# Patient Record
Sex: Female | Born: 1950 | Race: Black or African American | Hispanic: No | Smoking: Never smoker
Health system: Southern US, Community
[De-identification: ages and names within clinical notes are randomized; demographics above are authoritative.]

## PROBLEM LIST (undated history)

## (undated) DIAGNOSIS — M810 Age-related osteoporosis without current pathological fracture: Secondary | ICD-10-CM

## (undated) DIAGNOSIS — C50919 Malignant neoplasm of unspecified site of unspecified female breast: Secondary | ICD-10-CM

## (undated) DIAGNOSIS — K439 Ventral hernia without obstruction or gangrene: Secondary | ICD-10-CM

## (undated) DIAGNOSIS — D649 Anemia, unspecified: Secondary | ICD-10-CM

## (undated) DIAGNOSIS — Z9221 Personal history of antineoplastic chemotherapy: Secondary | ICD-10-CM

## (undated) DIAGNOSIS — Z923 Personal history of irradiation: Secondary | ICD-10-CM

## (undated) HISTORY — PX: LAPAROSCOPY: SHX197

## (undated) HISTORY — DX: Anemia, unspecified: D64.9

## (undated) HISTORY — PX: ABDOMINAL HYSTERECTOMY: SHX81

## (undated) HISTORY — DX: Malignant neoplasm of unspecified site of unspecified female breast: C50.919

## (undated) HISTORY — DX: Ventral hernia without obstruction or gangrene: K43.9

## (undated) HISTORY — DX: Age-related osteoporosis without current pathological fracture: M81.0

---

## 2013-10-07 DIAGNOSIS — Z9221 Personal history of antineoplastic chemotherapy: Secondary | ICD-10-CM

## 2013-10-07 DIAGNOSIS — C50919 Malignant neoplasm of unspecified site of unspecified female breast: Secondary | ICD-10-CM

## 2013-10-07 DIAGNOSIS — Z923 Personal history of irradiation: Secondary | ICD-10-CM

## 2013-10-07 HISTORY — DX: Malignant neoplasm of unspecified site of unspecified female breast: C50.919

## 2013-10-07 HISTORY — PX: BREAST LUMPECTOMY: SHX2

## 2013-10-07 HISTORY — DX: Personal history of irradiation: Z92.3

## 2013-10-07 HISTORY — DX: Personal history of antineoplastic chemotherapy: Z92.21

## 2013-11-07 HISTORY — PX: BREAST BIOPSY: SHX20

## 2018-01-13 ENCOUNTER — Other Ambulatory Visit: Payer: Self-pay | Admitting: *Deleted

## 2018-01-13 ENCOUNTER — Inpatient Hospital Stay
Admission: RE | Admit: 2018-01-13 | Discharge: 2018-01-13 | Disposition: A | Discharge status: Home / Self Care | Payer: Self-pay | Source: Ambulatory Visit | Attending: *Deleted | Admitting: *Deleted

## 2018-01-13 DIAGNOSIS — Z9289 Personal history of other medical treatment: Secondary | ICD-10-CM

## 2018-01-19 ENCOUNTER — Other Ambulatory Visit: Payer: Self-pay | Admitting: Internal Medicine

## 2018-01-19 DIAGNOSIS — C50911 Malignant neoplasm of unspecified site of right female breast: Secondary | ICD-10-CM

## 2018-01-19 DIAGNOSIS — Z170 Estrogen receptor positive status [ER+]: Principal | ICD-10-CM

## 2018-01-19 DIAGNOSIS — Z90710 Acquired absence of both cervix and uterus: Secondary | ICD-10-CM | POA: Insufficient documentation

## 2018-01-19 DIAGNOSIS — M810 Age-related osteoporosis without current pathological fracture: Secondary | ICD-10-CM | POA: Insufficient documentation

## 2018-01-23 ENCOUNTER — Other Ambulatory Visit: Payer: Self-pay | Admitting: *Deleted

## 2018-01-23 ENCOUNTER — Encounter: Payer: Self-pay | Admitting: Oncology

## 2018-01-23 ENCOUNTER — Inpatient Hospital Stay: Payer: Medicare HMO | Attending: Oncology | Admitting: Oncology

## 2018-01-23 VITALS — BP 168/81 | HR 77 | Temp 98.7°F | Resp 18 | Wt 117.7 lb

## 2018-01-23 DIAGNOSIS — Z79811 Long term (current) use of aromatase inhibitors: Secondary | ICD-10-CM | POA: Diagnosis not present

## 2018-01-23 DIAGNOSIS — N951 Menopausal and female climacteric states: Secondary | ICD-10-CM | POA: Diagnosis not present

## 2018-01-23 DIAGNOSIS — M810 Age-related osteoporosis without current pathological fracture: Secondary | ICD-10-CM | POA: Diagnosis not present

## 2018-01-23 DIAGNOSIS — Z5181 Encounter for therapeutic drug level monitoring: Secondary | ICD-10-CM

## 2018-01-23 DIAGNOSIS — M2550 Pain in unspecified joint: Secondary | ICD-10-CM | POA: Diagnosis not present

## 2018-01-23 DIAGNOSIS — Z170 Estrogen receptor positive status [ER+]: Secondary | ICD-10-CM | POA: Diagnosis not present

## 2018-01-23 DIAGNOSIS — C50911 Malignant neoplasm of unspecified site of right female breast: Secondary | ICD-10-CM | POA: Diagnosis present

## 2018-01-23 MED ORDER — ANASTROZOLE 1 MG PO TABS: 1.0000 mg | ORAL_TABLET | Freq: Every day | ORAL | 6 refills | Status: DC

## 2018-01-26 ENCOUNTER — Encounter: Payer: Self-pay | Admitting: Oncology

## 2018-01-26 DIAGNOSIS — C50919 Malignant neoplasm of unspecified site of unspecified female breast: Secondary | ICD-10-CM | POA: Insufficient documentation

## 2018-01-26 NOTE — Progress Notes (Signed)
Hematology/Oncology Consult note [facility] [phone] [phone]  Patient Care Team: Baxter Hire, MD as PCP - General (Internal Medicine)   Name of the patient: Ashley Ewing  [id number]  10/08/1950    Reason for referral- h/o breast cancer   Referring physician- Dr. 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  She was treated by Dr. Tyrone Nine at [place].  He received 12 cycles of Taxol and Herceptin following lumpectomy.  She completed adjuvant radiation in August 2015.  She has been on anastrozole since then.  She does have a history of osteoporosis and was previously on bisphosphonate which she stopped in 2017 due to cost issues.  She has now moved from [place] to [place] to live close to her cousin and plans to be here.  She is currently tolerating her anastrozole well and no significant side effects.  She does have occasional joint pain and hot flashes which are self-limited. Last mammogram was in 2017.   ECOG PS- 1  Pain scale- 0   Review of systems- Review of Systems  Constitutional: Negative for chills, fever, malaise/fatigue and weight loss.  HENT: Negative for congestion, ear discharge and nosebleeds.   Eyes: Negative for blurred vision.  Respiratory: Negative for cough, hemoptysis, sputum production, shortness of breath and wheezing.   Cardiovascular: Negative for chest pain, palpitations, orthopnea and claudication.  Gastrointestinal: Negative for abdominal pain, blood in stool, constipation, diarrhea, heartburn, melena, nausea and vomiting.  Genitourinary: Negative for dysuria, flank pain, frequency, hematuria and urgency.  Musculoskeletal: Positive for joint pain. Negative for back pain and myalgias.  Skin: Negative for  rash.  Neurological: Negative for dizziness, tingling, focal weakness, seizures, weakness and headaches.  Endo/Heme/Allergies: Does not bruise/bleed easily.  Psychiatric/Behavioral: Negative for depression and suicidal ideas. The patient does not have insomnia.     Allergies  Allergen Reactions  . Penicillins Hives    There are no active problems to display for this patient.    Past Medical History:  Diagnosis Date  . Anemia   . Breast cancer ([place])   . Hernia of abdominal wall   . Osteoporosis      Past Surgical History:  Procedure Laterality Date  . ABDOMINAL HYSTERECTOMY    . BREAST LUMPECTOMY Right   . LAPAROSCOPY      Social History   Socioeconomic History  . Marital status: Divorced    Spouse name: Not on file  . Number of children: Not on file  . Years of education: Not on file  . Highest education level: Not on file  Occupational History  . Not on file  Social Needs  . Financial resource strain: Not on file  . Food insecurity:    Worry: Not on file    Inability: Not on file  . Transportation needs:    Medical: Not on file    Non-medical: Not on file  Tobacco Use  . Smoking status: Not on file  Substance and Sexual Activity  . Alcohol use: Not on file  . Drug use: Not on file  . Sexual activity: Not on file  Lifestyle  . Physical activity:    Days per week: Not on file    Minutes per session: Not on file  . Stress: Not on file  Relationships  .  Social connections:    Talks on phone: Not on file    Gets together: Not on file    Attends religious service: Not on file    Active member of club or organization: Not on file    Attends meetings of clubs or organizations: Not on file    Relationship status: Not on file  . Intimate partner violence:    Fear of current or ex partner: Not on file    Emotionally abused: Not on file    Physically abused: Not on file    Forced sexual activity: Not on file  Other Topics Concern  . Not on file  Social  History Narrative  . Not on file     History reviewed. No pertinent family history.   Current Outpatient Medications:  .  anastrozole (ARIMIDEX) 1 MG tablet, Take 1 mg by mouth daily., Disp: , Rfl:  .  Calcium Carbonate-Vitamin D (CALCIUM 600+D PO), Take 600 mg by mouth daily., Disp: , Rfl:  .  MULTIPLE VITAMINS PO, Take 1 tablet by mouth daily., Disp: , Rfl:  .  anastrozole (ARIMIDEX) 1 MG tablet, Take 1 tablet (1 mg total) by mouth daily., Disp: 30 tablet, Rfl: 6   Physical exam:  Vitals:   01/23/18 1135  BP: (!) 168/81  Pulse: 77  Resp: 18  Temp: 98.7 F (37.1 C)  TempSrc: Tympanic  SpO2: 99%  Weight: 117 lb 11.2 oz (53.4 kg)   Physical Exam  Constitutional: She is oriented to person, place, and time.  HENT:  Head: Normocephalic and atraumatic.  Eyes: Pupils are equal, round, and reactive to light. EOM are normal.  Neck: Normal range of motion.  Cardiovascular: Normal rate, regular rhythm and normal heart sounds.  Pulmonary/Chest: Effort normal and breath sounds normal.  Abdominal: Soft. Bowel sounds are normal.  Neurological: She is alert and oriented to person, place, and time.  Skin: Skin is warm and dry.   Breast exam was performed in seated and lying down position. Patient is status post right lumpectomy with a well-healed surgical scar. No evidence of any palpable masses. No evidence of axillary adenopathy. No evidence of any palpable masses or lumps in the left breast. No evidence of leftt axillary adenopathy   Mm Outside Films Mammo  Result Date: 01/13/2018 This examination belongs to an outside facility and is stored here for comparison purposes only.  Contact the originating outside institution for any associated report or interpretation.   Assessment and plan- Patient is a 67 y.o. 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  She also completed  adjuvant radiation and is currently on arimidex    Patient will need to stay on Arimidex at least for 5 years if not for 10 years.  She would like to stop it next year when she completes 5 years.  I will refill her Arimidex prescription today.  She will also continue to take calcium 1200 mg along with vitamin D 800 IU. Her osteoporosis could be potentially worse due to this and requires monitoring  Patient does have a prior history of osteoporosis and we will obtain outside records including oncology notes pathology report and the last bone density scan.  We will schedule another bone density scan after that and discuss management of her osteoporosis following that  I will see her back in 6 months with CBC and CMP but potentially sooner if her bone  density scan is done  Dr. Edwina Barth her PCP will be coordinating her mammograms   Thank you for this kind referral and the opportunity to participate in the care of this patient   Visit Diagnosis 1. Visit for monitoring Arimidex therapy   2. Malignant neoplasm of right breast in female, estrogen receptor positive, unspecified site of breast ([place])   3. Osteoporosis, unspecified osteoporosis type, unspecified pathological fracture presence     Dr. Randa Evens, MD, MPH [facility] [phone] 01/26/2018 3:25 PM

## 2018-01-28 ENCOUNTER — Ambulatory Visit
Admission: RE | Admit: 2018-01-28 | Discharge: 2018-01-28 | Disposition: A | Discharge status: Home / Self Care | Payer: Medicare HMO | Source: Ambulatory Visit | Attending: Internal Medicine | Admitting: Internal Medicine

## 2018-01-28 DIAGNOSIS — Z9889 Other specified postprocedural states: Secondary | ICD-10-CM | POA: Diagnosis not present

## 2018-01-28 DIAGNOSIS — Z853 Personal history of malignant neoplasm of breast: Secondary | ICD-10-CM | POA: Insufficient documentation

## 2018-01-28 DIAGNOSIS — Z170 Estrogen receptor positive status [ER+]: Principal | ICD-10-CM

## 2018-01-28 DIAGNOSIS — C50911 Malignant neoplasm of unspecified site of right female breast: Secondary | ICD-10-CM

## 2018-01-28 HISTORY — DX: Personal history of irradiation: Z92.3

## 2018-01-28 HISTORY — DX: Personal history of antineoplastic chemotherapy: Z92.21

## 2018-07-20 ENCOUNTER — Other Ambulatory Visit: Payer: Self-pay

## 2018-07-20 DIAGNOSIS — E785 Hyperlipidemia, unspecified: Secondary | ICD-10-CM | POA: Insufficient documentation

## 2018-07-20 DIAGNOSIS — Z170 Estrogen receptor positive status [ER+]: Principal | ICD-10-CM

## 2018-07-20 DIAGNOSIS — C50911 Malignant neoplasm of unspecified site of right female breast: Secondary | ICD-10-CM

## 2018-07-21 ENCOUNTER — Other Ambulatory Visit: Payer: Self-pay | Admitting: Oncology

## 2018-07-24 ENCOUNTER — Encounter: Payer: Self-pay | Admitting: Oncology

## 2018-07-24 ENCOUNTER — Inpatient Hospital Stay: Payer: Medicare HMO

## 2018-07-24 ENCOUNTER — Inpatient Hospital Stay: Payer: Medicare HMO | Attending: Oncology | Admitting: Oncology

## 2018-07-24 VITALS — BP 152/81 | HR 65 | Temp 98.4°F | Resp 18 | Ht 64.5 in | Wt 129.7 lb

## 2018-07-24 DIAGNOSIS — Z08 Encounter for follow-up examination after completed treatment for malignant neoplasm: Secondary | ICD-10-CM

## 2018-07-24 DIAGNOSIS — Z79811 Long term (current) use of aromatase inhibitors: Secondary | ICD-10-CM

## 2018-07-24 DIAGNOSIS — Z853 Personal history of malignant neoplasm of breast: Secondary | ICD-10-CM

## 2018-07-24 DIAGNOSIS — C50911 Malignant neoplasm of unspecified site of right female breast: Secondary | ICD-10-CM | POA: Diagnosis present

## 2018-07-24 DIAGNOSIS — Z5181 Encounter for therapeutic drug level monitoring: Secondary | ICD-10-CM

## 2018-07-24 DIAGNOSIS — Z170 Estrogen receptor positive status [ER+]: Principal | ICD-10-CM

## 2018-07-24 DIAGNOSIS — M8580 Other specified disorders of bone density and structure, unspecified site: Secondary | ICD-10-CM | POA: Insufficient documentation

## 2018-07-24 LAB — COMPREHENSIVE METABOLIC PANEL
ALK PHOS: 127 U/L — AB (ref 38–126)
ALT: 14 U/L (ref 0–44)
AST: 26 U/L (ref 15–41)
Albumin: 4.1 g/dL (ref 3.5–5.0)
Anion gap: 9 (ref 5–15)
BILIRUBIN TOTAL: 0.6 mg/dL (ref 0.3–1.2)
BUN: 10 mg/dL (ref 8–23)
CALCIUM: 9.8 mg/dL (ref 8.9–10.3)
CO2: 28 mmol/L (ref 22–32)
CREATININE: 0.82 mg/dL (ref 0.44–1.00)
Chloride: 105 mmol/L (ref 98–111)
GFR calc Af Amer: >60 mL/min (ref >60)
Glucose, Bld: 101 mg/dL — ABNORMAL HIGH (ref 70–99)
Potassium: 4.2 mmol/L (ref 3.5–5.1)
Sodium: 142 mmol/L (ref 135–145)
TOTAL PROTEIN: 6.9 g/dL (ref 6.5–8.1)

## 2018-07-24 LAB — CBC WITH DIFFERENTIAL/PLATELET
Abs Immature Granulocytes: 0.01 10*3/uL (ref 0.00–0.07)
BASOS PCT: 0 %
Basophils Absolute: 0 10*3/uL (ref 0.0–0.1)
EOS PCT: 2 %
Eosinophils Absolute: 0.1 10*3/uL (ref 0.0–0.5)
HCT: 36.9 % (ref 36.0–46.0)
Hemoglobin: 12.1 g/dL (ref 12.0–15.0)
Immature Granulocytes: 0 %
Lymphocytes Relative: 40 %
Lymphs Abs: 1.9 10*3/uL (ref 0.7–4.0)
MCH: 29.4 pg (ref 26.0–34.0)
MCHC: 32.8 g/dL (ref 30.0–36.0)
MCV: 89.6 fL (ref 80.0–100.0)
MONO ABS: 0.4 10*3/uL (ref 0.1–1.0)
MONOS PCT: 8 %
Neutro Abs: 2.3 10*3/uL (ref 1.7–7.7)
Neutrophils Relative %: 50 %
PLATELETS: 222 10*3/uL (ref 150–400)
RBC: 4.12 MIL/uL (ref 3.87–5.11)
RDW: 12.5 % (ref 11.5–15.5)
WBC: 4.7 10*3/uL (ref 4.0–10.5)
nRBC: 0 % (ref 0.0–0.2)

## 2018-07-24 NOTE — Progress Notes (Signed)
No new changes noted today 

## 2018-07-27 NOTE — Progress Notes (Signed)
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  She was treated by Dr. Tyrone Nine at [place].  He received 12 cycles of Taxol and Herceptin following lumpectomy.  She completed adjuvant radiation in August 2015.  She has been on anastrozole since then.  She does have a history of osteoporosis and was previously on bisphosphonate which she stopped in 2017 due to cost issues.  She has now moved from [place] to [place] to live close to her cousin and plans to be here.  She is currently tolerating her anastrozole well and no significant side effects.  She does have occasional joint pain and hot flashes which are self-limited.   Interval history-she is tolerating her Arimidex well without any significant side effects.  However she wants to stop hormone therapy at 5 years which would be next year.  She denies any fatigue joint pains or hot flashes  ECOG PS- 1 Pain scale- 0 Opioid associated constipation- no  Review of systems- Review of Systems  Constitutional: Negative for chills, fever, malaise/fatigue and weight loss.  HENT: Negative for congestion, ear discharge and nosebleeds.   Eyes: Negative for blurred vision.  Respiratory: Negative for cough, hemoptysis, sputum production, shortness of breath and wheezing.   Cardiovascular: Negative for chest pain, palpitations, orthopnea and claudication.  Gastrointestinal: Negative for abdominal pain, blood in stool, constipation,  diarrhea, heartburn, melena, nausea and vomiting.  Genitourinary: Negative for dysuria, flank pain, frequency, hematuria and urgency.  Musculoskeletal: Negative for back pain, joint pain and myalgias.  Skin: Negative for rash.  Neurological: Negative for dizziness, tingling, focal weakness, seizures, weakness and headaches.  Endo/Heme/Allergies: Does not bruise/bleed easily.  Psychiatric/Behavioral: Negative for depression and suicidal ideas. The patient does not have insomnia.       Allergies  Allergen Reactions  . Penicillins Hives     Past Medical History:  Diagnosis Date  . Anemia   . Breast cancer ([place]) 2015   right breast ca invasive ductal carcinoma  . Hernia of abdominal wall   . Osteoporosis   . Personal history of chemotherapy 2015   right breast ca  . Personal history of radiation therapy 2015   right breast     Past Surgical History:  Procedure Laterality Date  . ABDOMINAL HYSTERECTOMY    . BREAST BIOPSY Right 11/2013   invasive ductal carcinoma  . 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 LAPAROSCOPY      Social History   Socioeconomic History  . Marital status: Divorced    Spouse name: Not on file  . Number of children: Not on file  . Years of education: Not on file  . Highest education level: Not on file  Occupational History  . Not on file  Social Needs  . Financial resource strain: Not on file  . Food insecurity:    Worry: Not on file    Inability: Not on file  . Transportation  needs:    Medical: Not on file    Non-medical: Not on file  Tobacco Use  . Smoking status: Never Smoker  . Smokeless tobacco: Never Used  Substance and Sexual Activity  . Alcohol use: Not on file  . Drug use: Not on file  . Sexual activity: Not on file  Lifestyle  . Physical activity:    Days per week: Not on file    Minutes per session: Not on file  . Stress: Not on file  Relationships  .  Social connections:    Talks on phone: Not on file    Gets together: Not on file    Attends religious service: Not on file    Active member of club or organization: Not on file    Attends meetings of clubs or organizations: Not on file    Relationship status: Not on file  . Intimate partner violence:    Fear of current or ex partner: Not on file    Emotionally abused: Not on file    Physically abused: Not on file    Forced sexual activity: Not on file  Other Topics Concern  . Not on file  Social History Narrative  . Not on file    History reviewed. No pertinent family history.   Current Outpatient Medications:  .  anastrozole (ARIMIDEX) 1 MG tablet, Take 1 mg by mouth daily., Disp: , Rfl:  .  Calcium Carbonate-Vitamin D (CALCIUM 600+D PO), Take 600 mg by mouth daily., Disp: , Rfl:  .  MULTIPLE VITAMINS PO, Take 1 tablet by mouth daily., Disp: , Rfl:  .  anastrozole (ARIMIDEX) 1 MG tablet, TAKE 1 TABLET BY MOUTH EVERY DAY, Disp: 90 tablet, Rfl: 0  Physical exam:  Vitals:   07/24/18 1419  BP: (!) 152/81  Pulse: 65  Resp: 18  Temp: 98.4 F (36.9 C)  TempSrc: Tympanic  SpO2: 97%  Weight: 129 lb 11.2 oz (58.8 kg)  Height: 5' 4.5" (1.638 m)   Physical Exam  Constitutional: She is oriented to person, place, and time.  Patient is thin.  Does not appear to be in any acute distress  HENT:  Head: Normocephalic and atraumatic.  Eyes: Pupils are equal, round, and reactive to light. EOM are normal.  Neck: Normal range of motion.  Cardiovascular: Normal rate, regular rhythm and normal heart sounds.  Pulmonary/Chest: Effort normal and breath sounds normal.  Abdominal: Soft. Bowel sounds are normal.  Neurological: She is alert and oriented to person, place, and time.  Skin: Skin is warm and dry.   Breast exam was performed in seated and lying down position. Patient is status post right lumpectomy with a well-healed surgical scar. No evidence of any palpable masses. No evidence of  axillary adenopathy. No evidence of any palpable masses or lumps in the left breast. No evidence of leftt axillary adenopathy   CMP Latest Ref Rng & Units 07/24/2018  Glucose 70 - 99 mg/dL 101(H)  BUN 8 - 23 mg/dL 10  Creatinine 0.44 - 1.00 mg/dL 0.82  Sodium 135 - 145 mmol/L 142  Potassium 3.5 - 5.1 mmol/L 4.2  Chloride 98 - 111 mmol/L 105  CO2 22 - 32 mmol/L 28  Calcium 8.9 - 10.3 mg/dL 9.8  Total Protein 6.5 - 8.1 g/dL 6.9  Total Bilirubin 0.3 - 1.2 mg/dL 0.6  Alkaline Phos 38 - 126 U/L 127(H)  AST 15 - 41 U/L 26  ALT 0 - 44 U/L 14   CBC Latest Ref Rng &  Units 07/24/2018  WBC 4.0 - 10.5 K/uL 4.7  Hemoglobin 12.0 - 15.0 g/dL 12.1  Hematocrit 36.0 - 46.0 % 36.9  Platelets 150 - 400 K/uL 222     Assessment and plan- Patient is a 67 y.o. 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  She also completed adjuvant radiation and is currently on arimidex.  She is here for routine surveillance of her breast cancer  Prior bone density scan done at [facility] in 2016 did show osteopenia.  We will repeat the bone density scan at this time.  Patient will continue with Arimidex along with calcium and vitamin D at this time and I will see her back in 6 months for routine follow-up but potentially sooner if there are any concerning findings on her repeat bone density scan.  Likely she is doing well and there is no evidence of recurrence on today's exam.  She did have a mammogram in April 2019 which did not reveal any evidence of malignancy   Visit Diagnosis 1. Encounter for follow-up surveillance of breast cancer   2. Visit for monitoring Arimidex therapy      Dr. Randa Evens, MD, MPH [facility] at [facility] [phone] 07/27/2018 1:43 PM

## 2018-08-10 ENCOUNTER — Other Ambulatory Visit: Payer: Self-pay | Admitting: *Deleted

## 2018-08-10 ENCOUNTER — Telehealth: Payer: Self-pay | Admitting: *Deleted

## 2018-08-10 DIAGNOSIS — Z170 Estrogen receptor positive status [ER+]: Principal | ICD-10-CM

## 2018-08-10 DIAGNOSIS — Z79811 Long term (current) use of aromatase inhibitors: Secondary | ICD-10-CM

## 2018-08-10 DIAGNOSIS — C50911 Malignant neoplasm of unspecified site of right female breast: Secondary | ICD-10-CM

## 2018-08-10 DIAGNOSIS — Z5181 Encounter for therapeutic drug level monitoring: Secondary | ICD-10-CM

## 2018-08-10 NOTE — Telephone Encounter (Signed)
I called patient back and left her a message that I did receive her message [facility] I have [facility] bone density and the date is 08/20/18 at 1:40 and arrive 15 min early at [facility] breast . Pt agreeable to the plan

## 2018-08-10 NOTE — Telephone Encounter (Signed)
Pt called to see if we got her last bone density from [facility]  WE did and the bone density was performed 05/24/2017. I have entered a bone density test. I have called mammography several times but the voicemail keeps coming on. I will keep trying and then call pt with  The date and time.  The patient did not answer when I called her so I left a detailed message with above on the message.

## 2018-08-10 NOTE — Telephone Encounter (Signed)
-----   Message from Secundino Ginger sent at 08/10/2018 11:43 AM EST ----- Regarding: test results Contact: [phone] Viann called and wanted to know if you received the Bone density results from [place] from her doctor there.

## 2018-08-20 ENCOUNTER — Ambulatory Visit
Admission: RE | Admit: 2018-08-20 | Discharge: 2018-08-20 | Disposition: A | Discharge status: Home / Self Care | Payer: Medicare HMO | Source: Ambulatory Visit | Attending: Oncology | Admitting: Oncology

## 2018-08-20 ENCOUNTER — Telehealth: Payer: Self-pay

## 2018-08-20 DIAGNOSIS — Z170 Estrogen receptor positive status [ER+]: Secondary | ICD-10-CM | POA: Diagnosis present

## 2018-08-20 DIAGNOSIS — Z5181 Encounter for therapeutic drug level monitoring: Secondary | ICD-10-CM | POA: Diagnosis present

## 2018-08-20 DIAGNOSIS — C50911 Malignant neoplasm of unspecified site of right female breast: Secondary | ICD-10-CM | POA: Diagnosis not present

## 2018-08-20 DIAGNOSIS — Z79811 Long term (current) use of aromatase inhibitors: Secondary | ICD-10-CM | POA: Diagnosis present

## 2018-08-20 NOTE — Telephone Encounter (Signed)
Spoke with the patient to infrom her that Dr Janese Banks would like to see her in 2-3 weeks for bone density results. The patient was agreeable and understanding. Patient has been schedule for 09/10/18 @ 9:45 AM.

## 2018-08-20 NOTE — Telephone Encounter (Signed)
-----   Message from Luella Cook, RN sent at 08/20/2018  4:22 PM EST -----   ----- Message ----- From: Sindy Guadeloupe, MD Sent: 08/20/2018   2:18 PM EST To: Luella Cook, RN  Bone density shows osteoporosis. Prior one showed osteopenia. I need to see her in 2-3 weeks to discuss this and see if she would be willing to take bisphosphonates. Thanks, Astrid Divine

## 2018-09-10 ENCOUNTER — Inpatient Hospital Stay: Payer: Medicare HMO | Admitting: Oncology

## 2018-10-18 ENCOUNTER — Other Ambulatory Visit: Payer: Self-pay | Admitting: Oncology

## 2019-01-26 ENCOUNTER — Inpatient Hospital Stay: Payer: Medicare HMO | Attending: Oncology | Admitting: Oncology

## 2019-01-27 NOTE — Telephone Encounter (Signed)
error 

## 2019-04-13 ENCOUNTER — Inpatient Hospital Stay: Payer: Medicare HMO | Attending: Oncology | Admitting: Oncology

## 2019-04-13 ENCOUNTER — Other Ambulatory Visit: Payer: Self-pay

## 2019-04-13 ENCOUNTER — Encounter: Payer: Self-pay | Admitting: Oncology

## 2019-04-13 VITALS — BP 163/81 | HR 65 | Temp 97.6°F | Wt 131.0 lb

## 2019-04-13 DIAGNOSIS — Z5181 Encounter for therapeutic drug level monitoring: Secondary | ICD-10-CM

## 2019-04-13 DIAGNOSIS — Z79811 Long term (current) use of aromatase inhibitors: Secondary | ICD-10-CM

## 2019-04-13 DIAGNOSIS — Z853 Personal history of malignant neoplasm of breast: Secondary | ICD-10-CM | POA: Diagnosis present

## 2019-04-13 DIAGNOSIS — M810 Age-related osteoporosis without current pathological fracture: Secondary | ICD-10-CM

## 2019-04-13 DIAGNOSIS — Z08 Encounter for follow-up examination after completed treatment for malignant neoplasm: Secondary | ICD-10-CM | POA: Diagnosis present

## 2019-04-13 NOTE — Progress Notes (Signed)
Pt in for follow up. Denies any concerns or difficulties.

## 2019-04-13 NOTE — Progress Notes (Signed)
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 She was treated by Dr. Tyrone Nine at [facility]. He received 12 cycles of Taxol and Herceptin following lumpectomy. She completed adjuvant radiation in August 2015.She has been on anastrozole since then. She does have a history of osteoporosis and was previously on bisphosphonate which she stopped in 2017 due to cost issues.  She has been on Arimidex since April 2015 and stopped taking it in April 2020.  Interval history-patient was last seen by me in October 2019 and did not follow-up since then.  She finished 5 years of Arimidex and stopped treatment in April 2020.  Currently she reports feeling well and denies any complaints at this time.  Her appetite and weight have remained stable.  Denies any new aches or pains anywhere  ECOG PS- 0 Pain scale- 0   Review of systems- Review of Systems  Constitutional: Negative for chills, fever, malaise/fatigue and weight loss.  HENT: Negative for congestion, ear discharge and nosebleeds.   Eyes: Negative for blurred vision.  Respiratory: Negative for cough, hemoptysis, sputum production, shortness of breath and wheezing.   Cardiovascular: Negative for chest pain, palpitations, orthopnea and claudication.  Gastrointestinal: Negative for abdominal pain, blood in stool, constipation, diarrhea, heartburn, melena, nausea and vomiting.  Genitourinary: Negative for dysuria, flank pain,  frequency, hematuria and urgency.  Musculoskeletal: Negative for back pain, joint pain and myalgias.  Skin: Negative for rash.  Neurological: Negative for dizziness, tingling, focal weakness, seizures, weakness and headaches.  Endo/Heme/Allergies: Does not bruise/bleed easily.  Psychiatric/Behavioral: Negative for depression and suicidal ideas. The patient does not have insomnia.      Allergies  Allergen Reactions  . Penicillins Hives     Past Medical History:  Diagnosis Date  . Anemia   . Breast cancer ([place]) 2015   right breast ca invasive ductal carcinoma  . Hernia of abdominal wall   . Osteoporosis   . Personal history of chemotherapy 2015   right breast ca  . Personal history of radiation therapy 2015   right breast     Past Surgical History:  Procedure Laterality Date  . ABDOMINAL HYSTERECTOMY    . BREAST BIOPSY Right 11/2013   invasive ductal carcinoma  . 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 LAPAROSCOPY      Social History   Socioeconomic History  . Marital status: Divorced    Spouse name: Not on file  . Number of children: Not on file  . Years of education: Not on file  . Highest education level: Not on file  Occupational History  . Not on file  Social Needs  . Financial resource strain: Not on file  . Food insecurity    Worry: Not on file    Inability: Not on file  . Transportation needs    Medical: Not on file    Non-medical: Not on file  Tobacco Use  . Smoking status: Never Smoker  . Smokeless  tobacco: Never Used  Substance and Sexual Activity  . Alcohol use: Not on file  . Drug use: Not on file  . Sexual activity: Not on file  Lifestyle  . Physical activity    Days per week: Not on file    Minutes per session: Not on file  . Stress: Not on file  Relationships  . Social Herbalist on phone: Not on file    Gets together: Not on file    Attends religious  service: Not on file    Active member of club or organization: Not on file    Attends meetings of clubs or organizations: Not on file    Relationship status: Not on file  . Intimate partner violence    Fear of current or ex partner: Not on file    Emotionally abused: Not on file    Physically abused: Not on file    Forced sexual activity: Not on file  Other Topics Concern  . Not on file  Social History Narrative  . Not on file    No family history on file.   Current Outpatient Medications:  .  anastrozole (ARIMIDEX) 1 MG tablet, Take 1 mg by mouth daily., Disp: , Rfl:  .  anastrozole (ARIMIDEX) 1 MG tablet, TAKE 1 TABLET BY MOUTH EVERY DAY, Disp: 90 tablet, Rfl: 0 .  Calcium Carbonate-Vitamin D (CALCIUM 600+D PO), Take 600 mg by mouth daily., Disp: , Rfl:  .  MULTIPLE VITAMINS PO, Take 1 tablet by mouth daily., Disp: , Rfl:   Physical exam:  Vitals:   04/13/19 0912  BP: (!) 163/81  Pulse: 65  Temp: 97.6 F (36.4 C)  TempSrc: Tympanic  Weight: 131 lb (59.4 kg)   Physical Exam HENT:     Head: Normocephalic and atraumatic.  Eyes:     Pupils: Pupils are equal, round, and reactive to light.  Neck:     Musculoskeletal: Normal range of motion.  Cardiovascular:     Rate and Rhythm: Normal rate and regular rhythm.     Heart sounds: Normal heart sounds.  Pulmonary:     Effort: Pulmonary effort is normal.     Breath sounds: Normal breath sounds.  Abdominal:     General: Bowel sounds are normal.     Palpations: Abdomen is soft.  Skin:    General: Skin is warm and dry.  Neurological:     Mental Status: She is alert and oriented to person, place, and time.      CMP Latest Ref Rng & Units 07/24/2018  Glucose 70 - 99 mg/dL 101(H)  BUN 8 - 23 mg/dL 10  Creatinine 0.44 - 1.00 mg/dL 0.82  Sodium 135 - 145 mmol/L 142  Potassium 3.5 - 5.1 mmol/L 4.2  Chloride 98 - 111 mmol/L 105  CO2 22 - 32 mmol/L 28  Calcium 8.9 - 10.3 mg/dL 9.8  Total Protein 6.5 - 8.1 g/dL 6.9  Total  Bilirubin 0.3 - 1.2 mg/dL 0.6  Alkaline Phos 38 - 126 U/L 127(H)  AST 15 - 41 U/L 26  ALT 0 - 44 U/L 14   CBC Latest Ref Rng & Units 07/24/2018  WBC 4.0 - 10.5 K/uL 4.7  Hemoglobin 12.0 - 15.0 g/dL 12.1  Hematocrit 36.0 - 46.0 % 36.9  Platelets 150 - 400 K/uL 222     Assessment and plan- Patient is a 68 y.o. female  with a history of stage I right breast invasive ductal carcinoma ER positive, PR  positive HER-2 positive status post with Herceptin following lumpectomy in 2015. She also completed adjuvant radiation and is currently on arimidex.  This is a routine follow-up visit for breast cancer  From breast cancer standpoint patient has completed 5 years of adjuvant Arimidex.  Given her ongoing osteoporosis it may be okay for her to stop it at this time which she did in April 2020.  Patient can continue to follow-up with Dr. Edwina Barth and get yearly mammograms and breast exams through him.  She can be referred to [place] in the future if questions or concerns arise.  No role for routine tumor markers or scans in the absence of concerning signs and symptoms.  Bone density scan in November 2019 showed osteoporosis with a T score of -3.3 at the AP spine.  Patient was previously getting parenteral bisphosphonates which she stopped because of cost issues before moving to [place].  I have encouraged her to discuss options for her osteoporosis with Dr. Edwina Barth including weekly Fosamax and monitoring bone density scan every other year.  Patient verbalized understanding   Visit Diagnosis 1. Encounter for follow-up surveillance of breast cancer   2. Visit for monitoring Arimidex therapy      Dr. Randa Evens, MD, MPH [facility] [phone] 04/13/2019 9:06 AM

## 2019-04-16 ENCOUNTER — Other Ambulatory Visit: Payer: Self-pay | Admitting: Internal Medicine

## 2019-04-16 DIAGNOSIS — C50911 Malignant neoplasm of unspecified site of right female breast: Secondary | ICD-10-CM

## 2019-04-16 DIAGNOSIS — Z170 Estrogen receptor positive status [ER+]: Secondary | ICD-10-CM

## 2019-04-29 ENCOUNTER — Ambulatory Visit
Admission: RE | Admit: 2019-04-29 | Discharge: 2019-04-29 | Disposition: A | Discharge status: Home / Self Care | Payer: Medicare HMO | Source: Ambulatory Visit | Attending: Internal Medicine | Admitting: Internal Medicine

## 2019-04-29 DIAGNOSIS — Z170 Estrogen receptor positive status [ER+]: Secondary | ICD-10-CM | POA: Diagnosis present

## 2019-04-29 DIAGNOSIS — R921 Mammographic calcification found on diagnostic imaging of breast: Secondary | ICD-10-CM | POA: Diagnosis not present

## 2019-04-29 DIAGNOSIS — C50911 Malignant neoplasm of unspecified site of right female breast: Secondary | ICD-10-CM | POA: Diagnosis present

## 2019-05-03 ENCOUNTER — Other Ambulatory Visit: Payer: Self-pay | Admitting: Internal Medicine

## 2019-05-03 DIAGNOSIS — R921 Mammographic calcification found on diagnostic imaging of breast: Secondary | ICD-10-CM

## 2019-11-03 ENCOUNTER — Ambulatory Visit
Admission: RE | Admit: 2019-11-03 | Discharge: 2019-11-03 | Disposition: A | Discharge status: Home / Self Care | Payer: Medicare HMO | Source: Ambulatory Visit | Attending: Internal Medicine | Admitting: Internal Medicine

## 2019-11-03 DIAGNOSIS — R921 Mammographic calcification found on diagnostic imaging of breast: Secondary | ICD-10-CM | POA: Diagnosis present

## 2019-11-05 ENCOUNTER — Other Ambulatory Visit: Payer: Self-pay | Admitting: Internal Medicine

## 2019-11-05 DIAGNOSIS — Z1231 Encounter for screening mammogram for malignant neoplasm of breast: Secondary | ICD-10-CM

## 2019-11-05 DIAGNOSIS — R921 Mammographic calcification found on diagnostic imaging of breast: Secondary | ICD-10-CM

## 2019-11-05 DIAGNOSIS — R928 Other abnormal and inconclusive findings on diagnostic imaging of breast: Secondary | ICD-10-CM

## 2020-01-14 ENCOUNTER — Other Ambulatory Visit: Payer: Medicare HMO

## 2020-01-24 ENCOUNTER — Other Ambulatory Visit: Payer: Self-pay

## 2020-01-24 ENCOUNTER — Ambulatory Visit (LOCAL_COMMUNITY_HEALTH_CENTER): Payer: Self-pay

## 2020-01-24 DIAGNOSIS — Z111 Encounter for screening for respiratory tuberculosis: Secondary | ICD-10-CM

## 2020-01-27 ENCOUNTER — Ambulatory Visit (LOCAL_COMMUNITY_HEALTH_CENTER): Payer: Medicare HMO

## 2020-01-27 ENCOUNTER — Other Ambulatory Visit: Payer: Self-pay

## 2020-01-27 DIAGNOSIS — Z111 Encounter for screening for respiratory tuberculosis: Secondary | ICD-10-CM

## 2020-01-27 LAB — TB SKIN TEST
Induration: 0 mm
TB Skin Test: NEGATIVE

## 2020-05-01 ENCOUNTER — Ambulatory Visit
Admission: RE | Admit: 2020-05-01 | Discharge: 2020-05-01 | Disposition: A | Discharge status: Home / Self Care | Payer: Medicare HMO | Source: Ambulatory Visit | Attending: Internal Medicine | Admitting: Internal Medicine

## 2020-05-01 DIAGNOSIS — R928 Other abnormal and inconclusive findings on diagnostic imaging of breast: Secondary | ICD-10-CM | POA: Insufficient documentation

## 2020-05-01 DIAGNOSIS — R921 Mammographic calcification found on diagnostic imaging of breast: Secondary | ICD-10-CM | POA: Insufficient documentation

## 2020-05-01 DIAGNOSIS — Z1231 Encounter for screening mammogram for malignant neoplasm of breast: Secondary | ICD-10-CM | POA: Diagnosis present

## 2020-07-07 ENCOUNTER — Other Ambulatory Visit: Payer: Self-pay | Admitting: Internal Medicine

## 2020-07-07 DIAGNOSIS — R10811 Right upper quadrant abdominal tenderness: Secondary | ICD-10-CM

## 2020-07-10 ENCOUNTER — Other Ambulatory Visit: Payer: Self-pay

## 2020-07-10 ENCOUNTER — Ambulatory Visit
Admission: RE | Admit: 2020-07-10 | Discharge: 2020-07-10 | Disposition: A | Discharge status: Home / Self Care | Payer: Medicare HMO | Source: Ambulatory Visit | Attending: Internal Medicine | Admitting: Internal Medicine

## 2020-07-10 DIAGNOSIS — R10811 Right upper quadrant abdominal tenderness: Secondary | ICD-10-CM | POA: Diagnosis present

## 2021-01-30 ENCOUNTER — Other Ambulatory Visit: Payer: Self-pay | Admitting: Internal Medicine

## 2021-01-30 DIAGNOSIS — C50911 Malignant neoplasm of unspecified site of right female breast: Secondary | ICD-10-CM

## 2021-05-02 ENCOUNTER — Inpatient Hospital Stay: Admission: RE | Admit: 2021-05-02 | Payer: Medicare Other | Source: Ambulatory Visit

## 2021-05-11 ENCOUNTER — Other Ambulatory Visit: Payer: Self-pay

## 2021-05-11 ENCOUNTER — Ambulatory Visit
Admission: RE | Admit: 2021-05-11 | Discharge: 2021-05-11 | Disposition: A | Discharge status: Home / Self Care | Payer: Medicare Other | Source: Ambulatory Visit | Attending: Internal Medicine | Admitting: Internal Medicine

## 2021-05-11 DIAGNOSIS — Z170 Estrogen receptor positive status [ER+]: Secondary | ICD-10-CM | POA: Insufficient documentation

## 2021-05-11 DIAGNOSIS — R921 Mammographic calcification found on diagnostic imaging of breast: Secondary | ICD-10-CM | POA: Diagnosis present

## 2021-05-11 DIAGNOSIS — C50911 Malignant neoplasm of unspecified site of right female breast: Secondary | ICD-10-CM

## 2021-05-11 DIAGNOSIS — Z853 Personal history of malignant neoplasm of breast: Secondary | ICD-10-CM | POA: Insufficient documentation

## 2022-03-28 ENCOUNTER — Other Ambulatory Visit: Payer: Self-pay | Admitting: Internal Medicine

## 2022-03-28 DIAGNOSIS — Z1231 Encounter for screening mammogram for malignant neoplasm of breast: Secondary | ICD-10-CM

## 2022-04-12 ENCOUNTER — Other Ambulatory Visit: Payer: Self-pay | Admitting: Internal Medicine

## 2022-04-12 DIAGNOSIS — Z1231 Encounter for screening mammogram for malignant neoplasm of breast: Secondary | ICD-10-CM

## 2022-05-14 ENCOUNTER — Ambulatory Visit
Admission: RE | Admit: 2022-05-14 | Discharge: 2022-05-14 | Disposition: A | Discharge status: Home / Self Care | Payer: Medicare Other | Source: Ambulatory Visit | Attending: Internal Medicine | Admitting: Internal Medicine

## 2022-05-14 DIAGNOSIS — Z1231 Encounter for screening mammogram for malignant neoplasm of breast: Secondary | ICD-10-CM | POA: Diagnosis present

## 2022-11-25 ENCOUNTER — Other Ambulatory Visit: Payer: Medicare Other

## 2022-11-25 ENCOUNTER — Ambulatory Visit (LOCAL_COMMUNITY_HEALTH_CENTER): Payer: Self-pay

## 2022-11-25 DIAGNOSIS — Z111 Encounter for screening for respiratory tuberculosis: Secondary | ICD-10-CM

## 2022-11-28 ENCOUNTER — Ambulatory Visit (LOCAL_COMMUNITY_HEALTH_CENTER): Payer: Medicare Other

## 2022-11-28 DIAGNOSIS — Z111 Encounter for screening for respiratory tuberculosis: Secondary | ICD-10-CM

## 2022-11-28 LAB — TB SKIN TEST
Induration: 0 mm
TB Skin Test: NEGATIVE

## 2023-01-28 IMAGING — MG DIGITAL DIAGNOSTIC BILAT W/ TOMO W/ CAD
9 of 13 series · 9 of 33 positions shown · non-contrast
Comparison: Previous exam(s).

CLINICAL DATA: Two year follow-up for probably benign
calcifications in the RIGHT lumpectomy site. History of RIGHT
lumpectomy with radiation treatment in 0198.

EXAM:
DIGITAL DIAGNOSTIC BILATERAL MAMMOGRAM WITH TOMOSYNTHESIS AND CAD
TECHNIQUE: Bilateral digital diagnostic mammography and breast tomosynthesis
was performed. The images were evaluated with computer-aided
detection.

[R MLO]
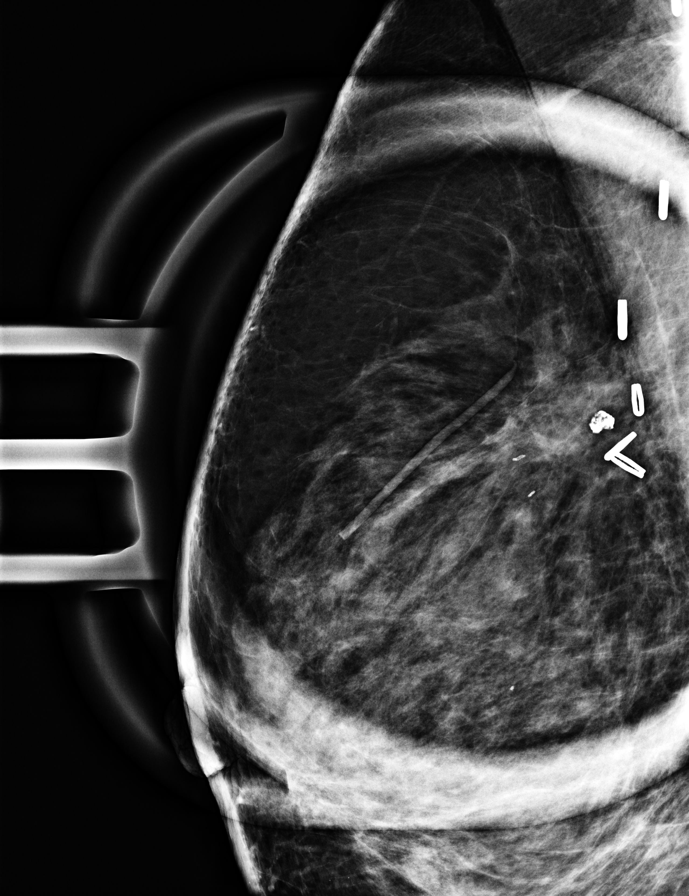

[R XCCL (1 of 2)]
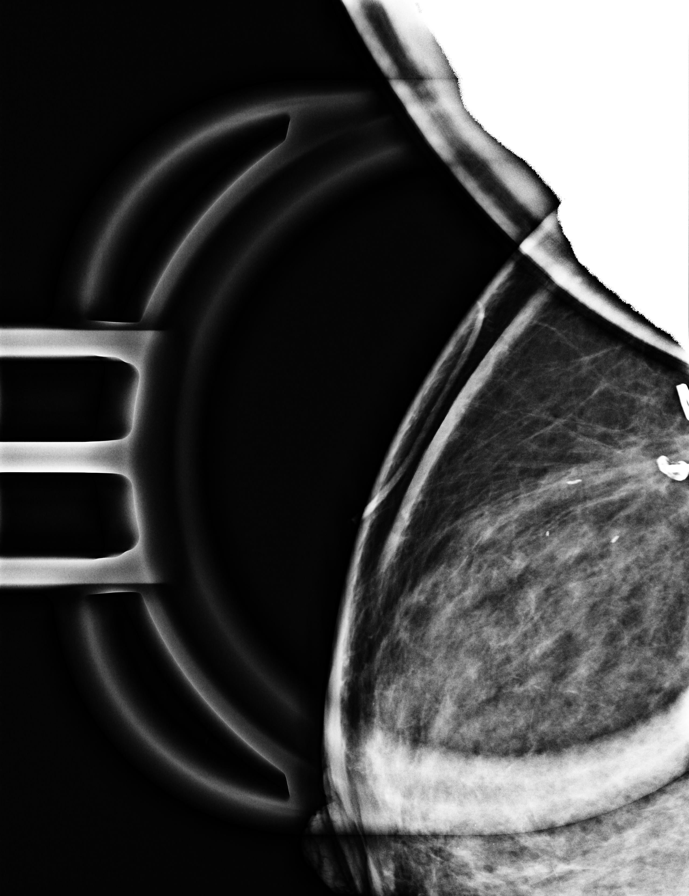

[R XCCL (2 of 2)]
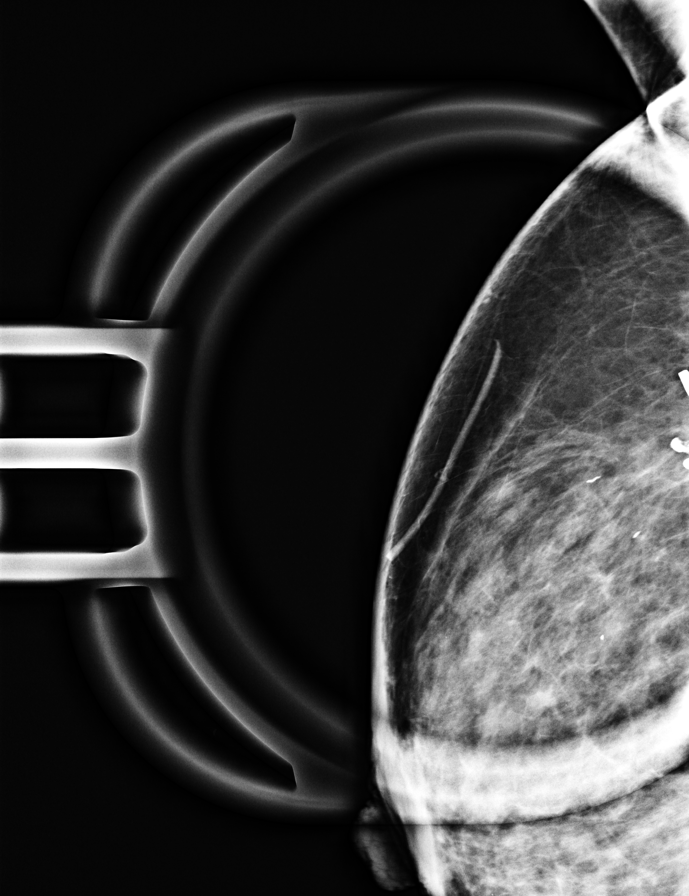

[R XCCL synth-2D]
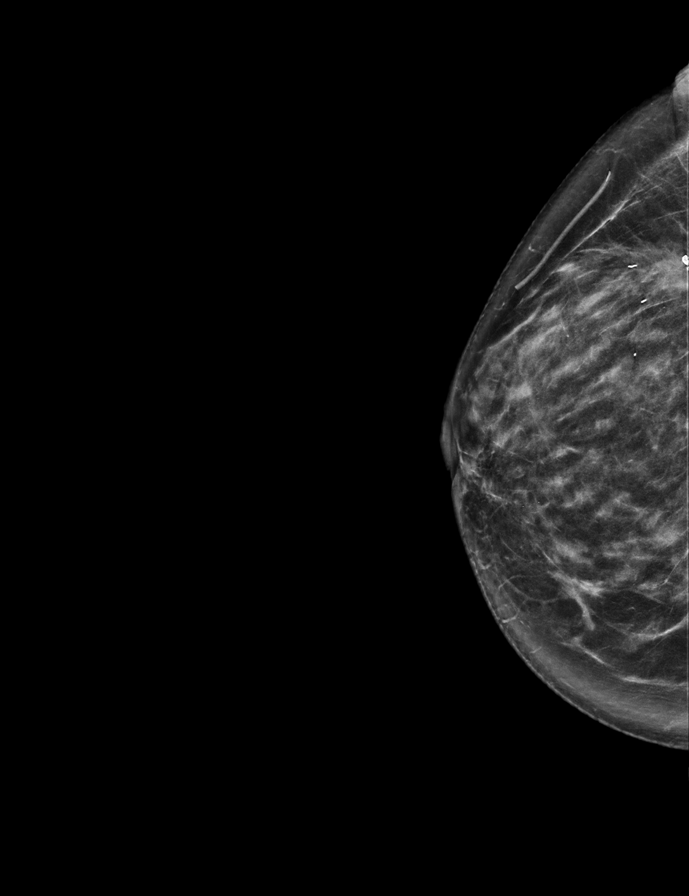

[L MLO synth-2D]
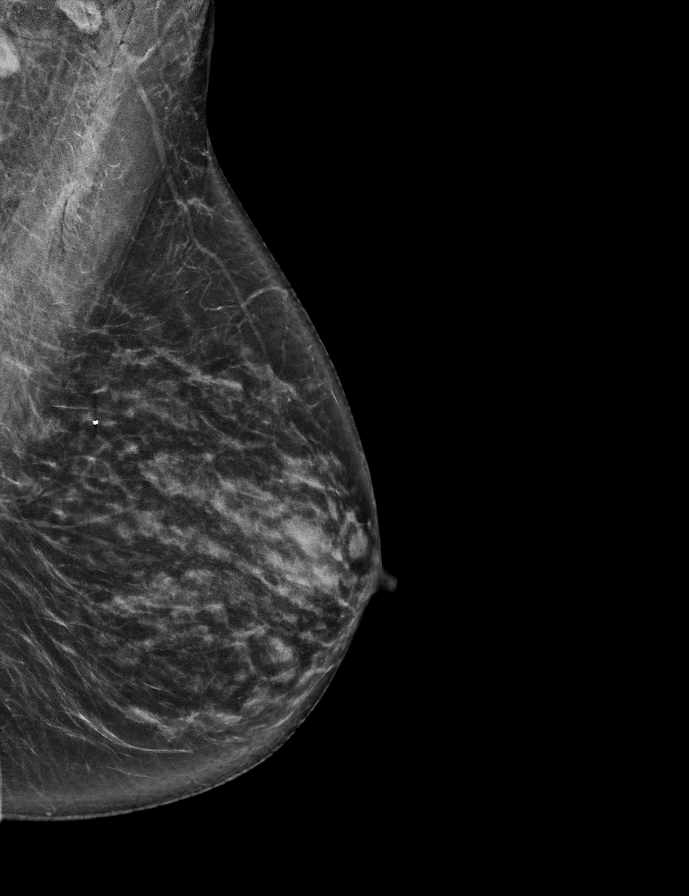

[R CC synth-2D]
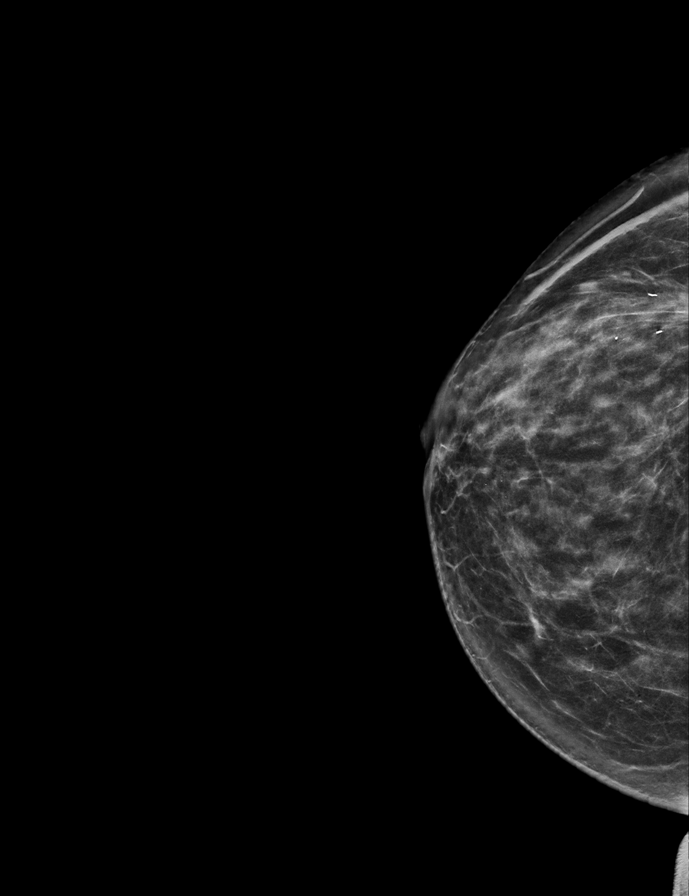

[L CC synth-2D]
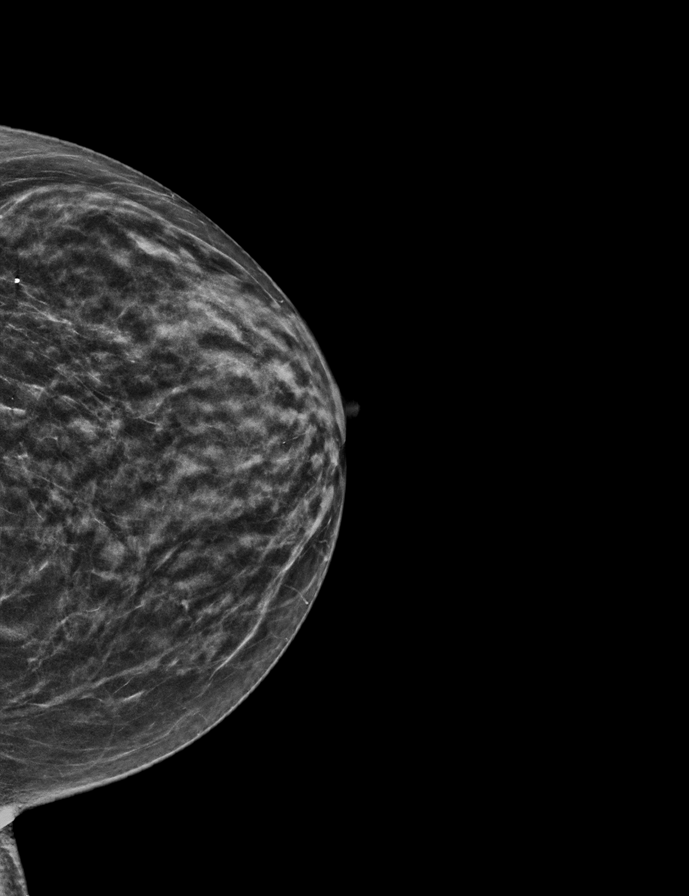

[R MLO synth-2D]
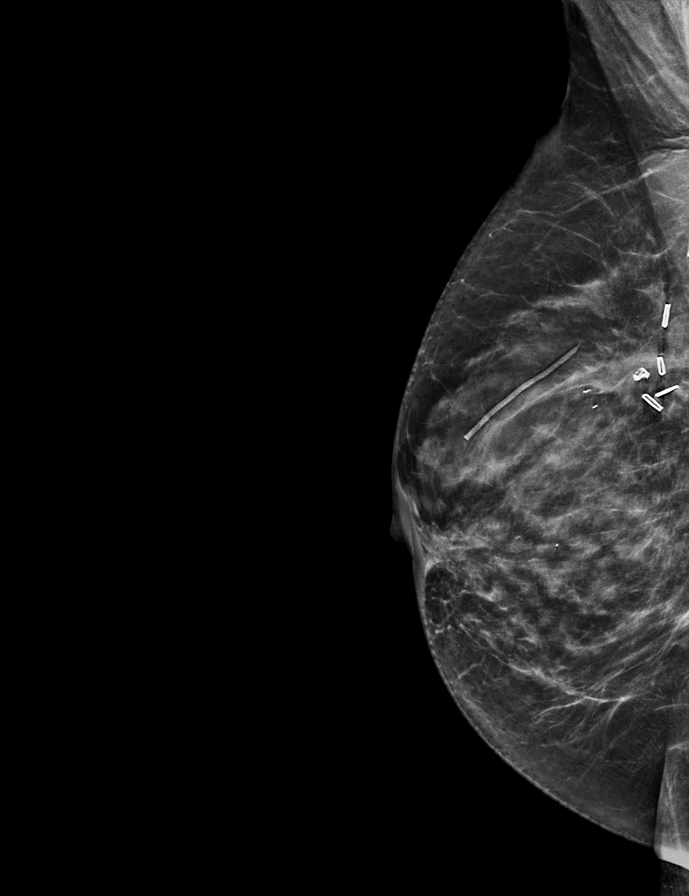

[L MLO tomo · tomo slice 32/63.0]
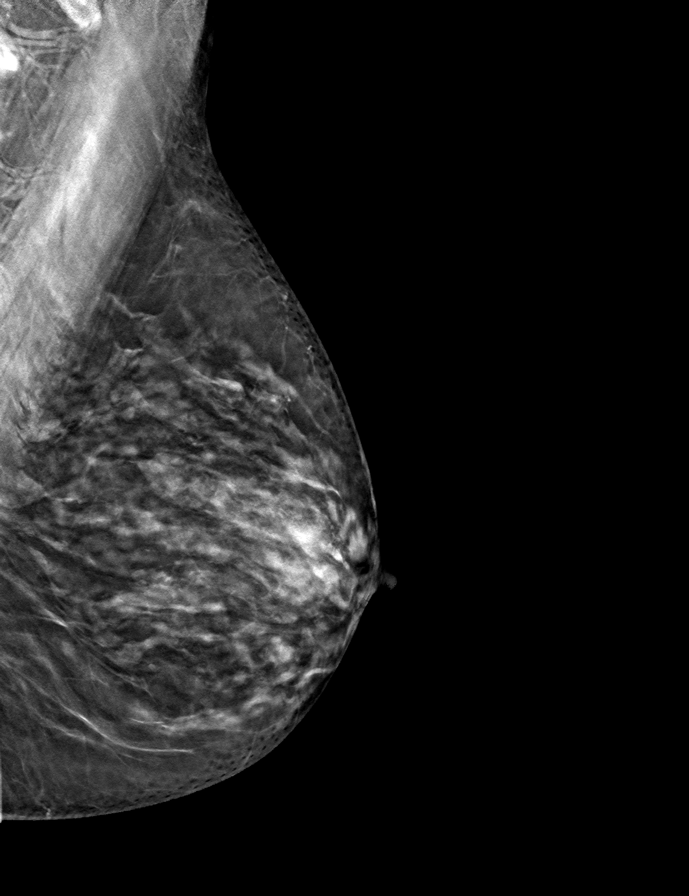

[9 of 33 positions shown; findings below may reference images not displayed]

ACR Breast Density Category c: The breast tissue is heterogeneously
dense, which may obscure small masses.
FINDINGS: Postoperative changes are identified in the UPPER OUTER QUADRANT of
the RIGHT breast. Coarse calcifications in the lumpectomy site are
benign. No new or suspicious findings in either breast.
IMPRESSION: Stable, benign RIGHT breast calcifications at the lumpectomy site in
the RIGHT breast. No mammographic evidence for malignancy.

RECOMMENDATION:
Screening mammogram in one year.(Code:[id number])

I have discussed the findings and recommendations with the patient.
If applicable, a reminder letter will be sent to the patient
regarding the next appointment.

BI-RADS CATEGORY  2: Benign.

## 2023-04-07 ENCOUNTER — Other Ambulatory Visit: Payer: Self-pay | Admitting: Internal Medicine

## 2023-04-07 DIAGNOSIS — Z1231 Encounter for screening mammogram for malignant neoplasm of breast: Secondary | ICD-10-CM

## 2023-05-16 ENCOUNTER — Ambulatory Visit
Admission: RE | Admit: 2023-05-16 | Discharge: 2023-05-16 | Disposition: A | Discharge status: Home / Self Care | Payer: Medicare Other | Source: Ambulatory Visit | Attending: Internal Medicine | Admitting: Internal Medicine

## 2023-05-16 DIAGNOSIS — Z1231 Encounter for screening mammogram for malignant neoplasm of breast: Secondary | ICD-10-CM | POA: Insufficient documentation

## 2023-05-21 ENCOUNTER — Other Ambulatory Visit: Payer: Self-pay | Admitting: Internal Medicine

## 2023-05-21 DIAGNOSIS — N6489 Other specified disorders of breast: Secondary | ICD-10-CM

## 2023-05-21 DIAGNOSIS — R928 Other abnormal and inconclusive findings on diagnostic imaging of breast: Secondary | ICD-10-CM

## 2023-05-22 ENCOUNTER — Ambulatory Visit
Admission: RE | Admit: 2023-05-22 | Discharge: 2023-05-22 | Disposition: A | Discharge status: Home / Self Care | Payer: Medicare Other | Source: Ambulatory Visit | Attending: Internal Medicine | Admitting: Internal Medicine

## 2023-05-22 DIAGNOSIS — N6489 Other specified disorders of breast: Secondary | ICD-10-CM | POA: Insufficient documentation

## 2023-05-22 DIAGNOSIS — R928 Other abnormal and inconclusive findings on diagnostic imaging of breast: Secondary | ICD-10-CM

## 2023-06-10 ENCOUNTER — Other Ambulatory Visit: Payer: Self-pay | Admitting: Internal Medicine

## 2023-06-10 DIAGNOSIS — Z0000 Encounter for general adult medical examination without abnormal findings: Secondary | ICD-10-CM

## 2023-06-10 DIAGNOSIS — R1013 Epigastric pain: Secondary | ICD-10-CM

## 2023-06-17 ENCOUNTER — Ambulatory Visit
Admission: RE | Admit: 2023-06-17 | Discharge: 2023-06-17 | Disposition: A | Discharge status: Home / Self Care | Payer: Medicare Other | Source: Ambulatory Visit | Attending: Internal Medicine | Admitting: Internal Medicine

## 2023-06-17 DIAGNOSIS — R1013 Epigastric pain: Secondary | ICD-10-CM | POA: Diagnosis present

## 2023-06-17 DIAGNOSIS — Z0000 Encounter for general adult medical examination without abnormal findings: Secondary | ICD-10-CM | POA: Diagnosis present

## 2023-06-17 MED ORDER — IOHEXOL 300 MG/ML  SOLN
100.0000 mL | Freq: Once | INTRAMUSCULAR | Status: AC | PRN
  Administered 2023-06-17: 100 mL via INTRAVENOUS

## 2024-02-26 ENCOUNTER — Other Ambulatory Visit: Payer: Self-pay | Admitting: Internal Medicine

## 2024-02-26 DIAGNOSIS — S0990XA Unspecified injury of head, initial encounter: Secondary | ICD-10-CM

## 2024-02-26 DIAGNOSIS — W19XXXA Unspecified fall, initial encounter: Secondary | ICD-10-CM

## 2024-03-05 ENCOUNTER — Ambulatory Visit
Admission: RE | Admit: 2024-03-05 | Discharge: 2024-03-05 | Disposition: A | Discharge status: Home / Self Care | Payer: Medicare (Managed Care) | Source: Ambulatory Visit | Attending: Internal Medicine | Admitting: Internal Medicine

## 2024-03-05 DIAGNOSIS — S0990XA Unspecified injury of head, initial encounter: Secondary | ICD-10-CM | POA: Insufficient documentation

## 2024-03-05 DIAGNOSIS — W19XXXA Unspecified fall, initial encounter: Secondary | ICD-10-CM | POA: Diagnosis not present

## 2024-03-05 DIAGNOSIS — Y92009 Unspecified place in unspecified non-institutional (private) residence as the place of occurrence of the external cause: Secondary | ICD-10-CM | POA: Diagnosis not present

## 2024-04-21 ENCOUNTER — Other Ambulatory Visit: Payer: Self-pay | Admitting: Internal Medicine

## 2024-04-21 DIAGNOSIS — Z1231 Encounter for screening mammogram for malignant neoplasm of breast: Secondary | ICD-10-CM

## 2024-05-27 ENCOUNTER — Ambulatory Visit
Admission: RE | Admit: 2024-05-27 | Discharge: 2024-05-27 | Disposition: A | Discharge status: Home / Self Care | Payer: Medicare (Managed Care) | Source: Ambulatory Visit | Attending: Internal Medicine | Admitting: Internal Medicine

## 2024-05-27 DIAGNOSIS — Z1231 Encounter for screening mammogram for malignant neoplasm of breast: Secondary | ICD-10-CM | POA: Diagnosis present
